# Patient Record
Sex: Female | Born: 1995 | Race: White | Hispanic: No | Marital: Single | Smoking: Current every day smoker
Health system: Southern US, Community
[De-identification: ages and names within clinical notes are randomized; demographics above are authoritative.]

## PROBLEM LIST (undated history)

## (undated) HISTORY — PX: WISDOM TOOTH EXTRACTION: SHX21

---
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
## 2017-01-20 ENCOUNTER — Ambulatory Visit
Admission: EM | Admit: 2017-01-20 | Discharge: 2017-01-20 | Disposition: A | Discharge status: Home / Self Care | Payer: Medicaid Other | Attending: Family Medicine | Admitting: Family Medicine

## 2017-01-20 ENCOUNTER — Encounter: Payer: Self-pay | Admitting: Gynecology

## 2017-01-20 ENCOUNTER — Other Ambulatory Visit: Payer: Self-pay

## 2017-01-20 DIAGNOSIS — R69 Illness, unspecified: Secondary | ICD-10-CM | POA: Diagnosis not present

## 2017-01-20 DIAGNOSIS — R51 Headache: Secondary | ICD-10-CM | POA: Diagnosis not present

## 2017-01-20 DIAGNOSIS — J029 Acute pharyngitis, unspecified: Secondary | ICD-10-CM

## 2017-01-20 DIAGNOSIS — R509 Fever, unspecified: Secondary | ICD-10-CM | POA: Diagnosis not present

## 2017-01-20 DIAGNOSIS — J111 Influenza due to unidentified influenza virus with other respiratory manifestations: Secondary | ICD-10-CM

## 2017-01-20 MED ORDER — OSELTAMIVIR PHOSPHATE 75 MG PO CAPS: 75.0000 mg | ORAL_CAPSULE | Freq: Two times a day (BID) | ORAL | 0 refills | Status: DC

## 2017-01-20 NOTE — ED Triage Notes (Signed)
Per patient c/o bilateral ear pain and sinus problem.

## 2017-01-20 NOTE — ED Provider Notes (Signed)
[facility]   CSN: [id number] Arrival date & time: 01/20/17  1138  History   Chief Complaint Chief Complaint  Patient presents with  . Otalgia  . Sinusitis   HPI  22 year old female presents with subjective fever, headache, ear pain, sore throat.  Started last night.  Subjective fever.  She has not taken her temperature.  She reports associated headache, bilateral otalgia, mild sore throat.  She denies any body aches.  She is taken ibuprofen without resolution.  She states that she feels that has helped her fever.  No reported sick contacts.  No known exacerbating factors.  No other associated symptoms.  No other complaints at this time.  History reviewed. No pertinent past medical history.  Past Surgical History:  Procedure Laterality Date  . WISDOM TOOTH EXTRACTION     OB History    No data available     Home Medications    Prior to Admission medications   Medication Sig Start Date End Date Taking? Authorizing Provider  oseltamivir (TAMIFLU) 75 MG capsule Take 1 capsule (75 mg total) by mouth every 12 (twelve) hours. 01/20/17   Tommie Sams, DO    Family History Family History  Problem Relation Age of Onset  . Healthy Mother   . Healthy Father     Social History Social History   Tobacco Use  . Smoking status: Former Games developer  . Smokeless tobacco: Never Used  Substance Use Topics  . Alcohol use: No  . Drug use: No     Allergies   Patient has no known allergies.   Review of Systems Review of Systems  Constitutional: Positive for fever.  HENT: Positive for ear pain and sore throat.   Neurological: Positive for headaches.   Physical Exam Triage Vital Signs ED Triage Vitals  Enc Vitals Group     BP 01/20/17 1225 (!) 102/51     Pulse Rate 01/20/17 1225 (!) 109     Resp --      Temp 01/20/17 1225 98.5 F (36.9 C)     Temp Source 01/20/17 1225 Oral     SpO2 01/20/17 1225 100 %     Weight 01/20/17 1227 154 lb 15.7 oz (70.3 kg)     Height  01/20/17 1227 5\' 6"  (1.676 m)     Head Circumference --      Peak Flow --      Pain Score 01/20/17 1228 7     Pain Loc --      Pain Edu? --      Excl. in GC? --    Updated Vital Signs BP (!) 102/51 (BP Location: Left Arm)   Pulse (!) 109   Temp 98.5 F (36.9 C) (Oral)   Ht 5\' 6"  (1.676 m)   Wt 154 lb 15.7 oz (70.3 kg)   LMP 01/06/2017   SpO2 100%   BMI 25.01 kg/m     Physical Exam  Constitutional: She is oriented to person, place, and time. She appears well-developed and well-nourished. No distress.  HENT:  Head: Normocephalic and atraumatic.  Eyes: Conjunctivae are normal. Right eye exhibits no discharge. Left eye exhibits no discharge.  Neck: Neck supple.  Cardiovascular:  Tachycardia.  Regular rhythm.  Pulmonary/Chest: Effort normal and breath sounds normal. She has no wheezes. She has no rales.  Lymphadenopathy:    She has no cervical adenopathy.  Neurological: She is alert and oriented to person, place, and time.  Psychiatric: She has a normal mood and affect.  Her behavior is normal.  Nursing note and vitals reviewed.  UC Treatments / Results  Labs (all labs ordered are listed, but only abnormal results are displayed) Labs Reviewed - No data to display  EKG  EKG Interpretation None       Radiology No results found.  Procedures Procedures (including critical care time)  Medications Ordered in UC Medications - No data to display   Initial Impression / Assessment and Plan / UC Course  I have reviewed the triage vital signs and the nursing notes.  Pertinent labs & imaging results that were available during my care of the patient were reviewed by me and considered in my medical decision making (see chart for details).     22 year old female presents with respiratory symptoms consistent with possible influenza.  Patient declined influenza testing today after discussion.  Patient elected to treat empirically for influenza.  Sent in Tamiflu.  Final  Clinical Impressions(s) / UC Diagnoses   Final diagnoses:  Influenza-like illness    ED Discharge Orders        Ordered    oseltamivir (TAMIFLU) 75 MG capsule  Every 12 hours     01/20/17 1246     Controlled Substance Prescriptions [place] Controlled Substance Registry consulted? No   Tommie SamsCook, Rawad Bochicchio G, [place]DO 01/20/17 1300

## 2017-01-20 NOTE — Discharge Instructions (Signed)
Start the tamiflu asap.  Tylenol/motrin as needed.  Take care  Dr. Adriana Simasook

## 2018-11-28 ENCOUNTER — Encounter: Payer: Self-pay | Admitting: Advanced Practice Midwife

## 2018-11-28 ENCOUNTER — Ambulatory Visit (LOCAL_COMMUNITY_HEALTH_CENTER): Payer: Medicaid Other | Admitting: Advanced Practice Midwife

## 2018-11-28 ENCOUNTER — Other Ambulatory Visit: Payer: Self-pay

## 2018-11-28 VITALS — BP 130/76 | Ht 66.0 in | Wt 149.0 lb

## 2018-11-28 DIAGNOSIS — Z3009 Encounter for other general counseling and advice on contraception: Secondary | ICD-10-CM | POA: Diagnosis not present

## 2018-11-28 DIAGNOSIS — F1290 Cannabis use, unspecified, uncomplicated: Secondary | ICD-10-CM | POA: Insufficient documentation

## 2018-11-28 DIAGNOSIS — F17200 Nicotine dependence, unspecified, uncomplicated: Secondary | ICD-10-CM | POA: Insufficient documentation

## 2018-11-28 DIAGNOSIS — F1210 Cannabis abuse, uncomplicated: Secondary | ICD-10-CM | POA: Insufficient documentation

## 2018-11-28 DIAGNOSIS — Z3041 Encounter for surveillance of contraceptive pills: Secondary | ICD-10-CM

## 2018-11-28 DIAGNOSIS — Z113 Encounter for screening for infections with a predominantly sexual mode of transmission: Secondary | ICD-10-CM

## 2018-11-28 LAB — WET PREP FOR TRICH, YEAST, CLUE
Trichomonas Exam: NEGATIVE
Yeast Exam: NEGATIVE

## 2018-11-28 MED ORDER — NORGESTIM-ETH ESTRAD TRIPHASIC 0.18/0.215/0.25 MG-35 MCG PO TABS: 1.0000 | ORAL_TABLET | Freq: Every day | ORAL | 2 refills | Status: DC

## 2018-11-28 MED ORDER — NORGESTIM-ETH ESTRAD TRIPHASIC 0.18/0.215/0.25 MG-25 MCG PO TABS: 1.0000 | ORAL_TABLET | Freq: Every day | ORAL | 2 refills | Status: DC

## 2018-11-28 NOTE — Progress Notes (Signed)
   [place] problem visit  [place] Department  Subjective:  Tracy Mckinney is a 23 y.o. G2P0 smoker (vape) being seen today for STD screen, first pap, more ocp's  Chief Complaint  Patient presents with  . Contraception    needs birth control refilled  . Gynecologic Exam    pap smear  . SEXUALLY TRANSMITTED DISEASE    STD screening    HPI 23 yo G2P0 smoker (vapes daily) wants more OTC.  LMP 11/22/18.  Last sex yesterday without condom.  Last physical 03/2017  Does the patient have a current or past history of drug use? No   No components found for: HCV]   Health Maintenance Due  Topic Date Due  . HIV Screening  01/21/2012  . TETANUS/TDAP  01/21/2016  . PAP-Cervical Cytology Screening  01/20/2018  . PAP SMEAR-Modifier  01/20/2018  . INFLUENZA VACCINE  08/19/2018    ROS  The following portions of the patient's history were reviewed and updated as appropriate: allergies, current medications, past family history, past medical history, past social history, past surgical history and problem list. Problem list updated.   See flowsheet for other program required questions.  Objective:   Vitals:   11/28/18 0828  BP: 130/76  Weight: 149 lb (67.6 kg)  Height: 5\' 6"  (1.676 m)    Physical Exam Abdominal:     Comments: +striae, soft without tenderness  Genitourinary:    General: Normal vulva.     Exam position: Lithotomy position.     Labia:        Right: No lesion.        Left: No lesion.      Vagina: Vaginal discharge (white creamy leukorrhea, ph<4.5) present.     Cervix: Normal.     Uterus: Normal.      Adnexa: Right adnexa normal and left adnexa normal.     Rectum: Normal.       Assessment and Plan:  Tracy Mckinney is a 23 y.o. female presenting to the [facility] Department for a Women's Health problem visit  1. Screening examination for venereal disease Treat wet mount per standing orders Immunization nurse consult -  WET PREP FOR [place], YEAST, CLUE - Syphilis Serology, [facility]  2. Family planning First pap done - Pap IG (Image Guided)  3. Encounter for surveillance of contraceptive pills OTC #3 I po daily. To return 3 mo for BP check  4. Smoker--vapes  Counseled via 5 A's to stop vaping.  BP=130/76.  Repeat =112/69  5. Marijuana abuse Last use 2 wks ago     Return in about 3 months (around 02/28/2019) for BP check.  No future appointments.  Herbie Saxon, CNM

## 2018-11-28 NOTE — Progress Notes (Signed)
Pt here for more OCP's pt currently on Ortho Tri Cyclen. Pt reports she was on Prednisone for poison ivy and that she took her last dose this morning. Last period was a normal flow but came 2 or 3 days late (started 11/22/2018). Pt also desires STD screening and blood work for HIV and syphillis.Ronny Bacon, RN

## 2018-11-28 NOTE — Progress Notes (Signed)
Wet mount reviewed and is negative today, so no treatment needed for wet mount per standing order. Pt's repeat BP is 112/69. Counseled pt per Ola Spurr, CNM orders and pt aware that OCP's were e-prescribed by Ola Spurr, CNM to pt's pharmacy on file. Pt aware to give [place] a call with any questions or concerns. Provider orders completed.Ronny Bacon, RN

## 2018-12-05 LAB — PAP IG (IMAGE GUIDED): PAP Smear Comment: 0

## 2018-12-08 ENCOUNTER — Telehealth: Payer: Self-pay

## 2018-12-08 DIAGNOSIS — A749 Chlamydial infection, unspecified: Secondary | ICD-10-CM

## 2018-12-08 NOTE — Telephone Encounter (Signed)
TC to patient. Verified ID via password/SS#. Informed of positive chlamydia and need for tx. Instructed to eat before visit and have partner call for tx appt. Appt scheduled.Pearlene Teat, RN    

## 2018-12-11 ENCOUNTER — Telehealth: Payer: Self-pay

## 2018-12-11 NOTE — Telephone Encounter (Signed)
TC from patient.  Reports made an appt with PCP for tx this week.  Declines to R/S tx appt here at [place], RN

## 2019-02-07 ENCOUNTER — Telehealth: Payer: Self-pay | Admitting: Family Medicine

## 2019-02-07 ENCOUNTER — Other Ambulatory Visit: Payer: Self-pay | Admitting: Advanced Practice Midwife

## 2019-02-07 DIAGNOSIS — Z3009 Encounter for other general counseling and advice on contraception: Secondary | ICD-10-CM

## 2019-02-07 NOTE — Telephone Encounter (Signed)
Per chart at IS visit, 11/2018, patient had elevated BP and was given 3 packs of pills with note stating that she needs to RTC for BP check prior to being given more pills.  Please schedule for BP check.

## 2019-02-07 NOTE — Telephone Encounter (Signed)
TC to patient to schedule BP check appointment before more OC prescribed. LM with number to call.Burt Knack, RN

## 2019-02-07 NOTE — Telephone Encounter (Signed)
Patient would like a prescription for more bc pills sent to her pharmacy... [facility] [address].

## 2019-02-07 NOTE — Telephone Encounter (Signed)
Pt cannot have any more ocp's until she comes in for BP check.  RN attempted to call pt on 02/07/19 to tell her this

## 2019-02-08 NOTE — Telephone Encounter (Signed)
Pt. Is returning a nurses call about her birth control pills.

## 2019-02-09 NOTE — Telephone Encounter (Signed)
TC with patient. Explained that she needs a provider appt to reck BP before she receives more pills.  Appt scheduled. Richmond Campbell, RN

## 2019-02-12 ENCOUNTER — Other Ambulatory Visit: Payer: Self-pay | Admitting: Family Medicine

## 2019-02-12 ENCOUNTER — Encounter: Payer: Self-pay | Admitting: Family Medicine

## 2019-02-12 ENCOUNTER — Ambulatory Visit: Payer: Medicaid Other | Admitting: Family Medicine

## 2019-02-12 ENCOUNTER — Other Ambulatory Visit: Payer: Self-pay

## 2019-02-12 DIAGNOSIS — F17200 Nicotine dependence, unspecified, uncomplicated: Secondary | ICD-10-CM

## 2019-02-12 DIAGNOSIS — Z3041 Encounter for surveillance of contraceptive pills: Secondary | ICD-10-CM

## 2019-02-12 DIAGNOSIS — Z3009 Encounter for other general counseling and advice on contraception: Secondary | ICD-10-CM

## 2019-02-12 MED ORDER — NORGESTIM-ETH ESTRAD TRIPHASIC 0.18/0.215/0.25 MG-35 MCG PO TABS: 1.0000 | ORAL_TABLET | Freq: Every day | ORAL | 13 refills | Status: DC

## 2019-02-12 MED ORDER — NORGESTIM-ETH ESTRAD TRIPHASIC 0.18/0.215/0.25 MG-35 MCG PO TABS: 1.0000 | ORAL_TABLET | Freq: Every day | ORAL | 11 refills | Status: DC

## 2019-02-12 NOTE — Progress Notes (Signed)
Family Planning Visit  Subjective:  Tracy Mckinney is a 24 y.o. being seen today for  Chief Complaint  Patient presents with  . Contraception    Pt has Smoker--vapes  and Marijuana abuse on their problem list.  HPI  Patient reports she is here for refill of OCP. Has been taking x1.5 yrs. She vapes, does not smoke cigarettes.   Pt does not meet any of the following contraindications to estrogen use: -Age ?35 years and smoking ?15 cigarettes per day -Migraine with aura -Two or more RF for arterial CVD (such as older age, smoking, diabetes, and hypertension) -HTN -Breast cancer -VTE hx or acute event -Known thrombogenic mutations -Known ischemic heart disease -History of stroke -Complicated valvular heart disease (pulmonary HTN, risk for afib, hx subacute bacterial endocarditis) -Cirrhosis, Hepatocellular adenoma or malignant hepatoma    No LMP recorded. Pt desires EC? N/a - continuous use OCP  Last pap: Nov, 2020.  Patient reports 1 partner(s) in last year. Do they desire STI screening (if no, why not)? no  Does the patient desire a pregnancy in the next year? no   24 y.o., There is no height or weight on file to calculate BMI. - Is patient eligible for HA1C diabetes screening based on BMI and age >30?  no  Does the patient have a current or past history of drug use? yes No components found for: HCV  See flowsheet for other program required questions.   Health Maintenance Due  Topic Date Due  . TETANUS/TDAP  01/21/2016  . INFLUENZA VACCINE  08/19/2018    ROS  The following portions of the patient's history were reviewed and updated as appropriate: allergies, current medications, past family history, past medical history, past social history, past surgical history and problem list. Problem list updated.  Objective:  There were no vitals taken for this visit.  RN showed me vitals, which were all WNL, though they weren't entered into EMR. I believe BP was 127/80.    Physical Exam  Gen: well appearing, NAD HEENT: no scleral icterus Lung: Normal WOB Ext: warm well perfused     Assessment and Plan:  Tracy Mckinney is a 24 y.o. female presenting to the [facility] Department for a well woman exam/family planning visit  Contraception counseling: Reviewed all forms of birth control options in the tiered based approach including abstinence; over the counter/barrier methods; hormonal contraceptive medication including pill, patch, ring, injection, contraceptive implant; hormonal and nonhormonal IUDs; permanent sterilization options including vasectomy and the various tubal sterilization modalities. Risks, benefits, how to discontinue and typical effectiveness rates were reviewed.  Questions were answered.  Written information was also given to the patient to review.  Patient desires OCP, this was prescribed for patient. She will follow up in  1 yr for surveillance.  She was told to call with any further questions, or with any concerns about this method of contraception.  Emphasized use of condoms 100% of the time for STI prevention.  Emergency Contraception: n/a - continuous use OCP   1. Encounter for surveillance of contraceptive pills -Rx OCP x1 yr. Counseling as above - Norgestimate-Ethinyl Estradiol Triphasic (ORTHO TRI-CYCLEN, 28,) 0.18/0.215/0.25 MG-35 MCG tablet; Take 1 tablet by mouth daily.  Dispense: 1 Package; Refill: 11  2. Smoker--vapes  -Risks of tob use with OCP discussed. Pt's goal is to quit completely. Encouraged pt in their desire to quit, counseled using 5 A's. Tobacco Quitline info given and offered referral for behavioral health which pt declines  for now.     Return in about 1 year (around 02/12/2020) for yearly wellness exam.  No future appointments.  Kandee Keen, PA-C

## 2019-02-12 NOTE — Progress Notes (Signed)
Patient here for BP check and needs more OC. Patient pharmacy verified. Patient was told at last visit that she would need BP check before receiving another OC prescription. Patient on her way to work, and just picked up last pack OC.Marland KitchenBurt Knack, RN

## 2019-02-28 ENCOUNTER — Ambulatory Visit: Payer: Medicaid Other

## 2019-06-10 ENCOUNTER — Encounter: Payer: Self-pay | Admitting: Emergency Medicine

## 2019-06-10 ENCOUNTER — Other Ambulatory Visit: Payer: Self-pay

## 2019-06-10 ENCOUNTER — Emergency Department
Admission: EM | Admit: 2019-06-10 | Discharge: 2019-06-10 | Disposition: A | Discharge status: Home / Self Care | Payer: Medicaid Other | Attending: Student | Admitting: Student

## 2019-06-10 DIAGNOSIS — N898 Other specified noninflammatory disorders of vagina: Secondary | ICD-10-CM | POA: Insufficient documentation

## 2019-06-10 DIAGNOSIS — F17290 Nicotine dependence, other tobacco product, uncomplicated: Secondary | ICD-10-CM | POA: Insufficient documentation

## 2019-06-10 DIAGNOSIS — R509 Fever, unspecified: Secondary | ICD-10-CM | POA: Insufficient documentation

## 2019-06-10 MED ORDER — DOXYCYCLINE HYCLATE 100 MG PO CAPS
100.0000 mg | ORAL_CAPSULE | Freq: Two times a day (BID) | ORAL | 0 refills | Status: AC
Start: 2019-06-10 — End: 2019-06-20

## 2019-06-10 MED ORDER — AZITHROMYCIN 500 MG PO TABS
1000.0000 mg | ORAL_TABLET | Freq: Once | ORAL | Status: AC
  Administered 2019-06-10: 1000 mg via ORAL
  Filled 2019-06-10: qty 2

## 2019-06-10 MED ORDER — METRONIDAZOLE 500 MG PO TABS
500.0000 mg | ORAL_TABLET | Freq: Two times a day (BID) | ORAL | 0 refills | Status: AC
Start: 2019-06-10 — End: 2019-06-20

## 2019-06-10 NOTE — Discharge Instructions (Signed)
Thank you for letting us take care of you in the emergency department today.   Please continue to take any regular, prescribed medications.   New medications we have prescribed:  Doxycycline Metronidazole  Please take these prescriptions as written.  Take with food as sometimes they can cause upset stomach.  We advise nothing in the vagina until the area of irritation is healed, including tampons, intercourse, etc.  Please follow up with: An OB/GYN doctor to follow-up on your symptoms and for an exam recheck.  Please follow-up with one of the doctors listed below.  Please return to the ER for any new or worsening symptoms.

## 2019-06-10 NOTE — ED Triage Notes (Signed)
Pt to ED via POV, pt states that she has laceration at her vaginal opening. Pt states that it is now painful and swollen. Pt states that she also has fever and chills. Pt is in NAD, tachy in triage at 111

## 2019-06-10 NOTE — ED Provider Notes (Signed)
[facility] Emergency Department Provider Note  ____________________________________________   First MD Initiated Contact with Patient 06/10/19 1039     (approximate)  I have reviewed the triage vital signs and the nursing notes.  History  Chief Complaint Laceration and Fever    HPI Tracy Mckinney is a 24 y.o. female past medical history as below, who presents to the emergency department with concern for possible vaginal laceration.  Patient states she was putting a tampon in on Friday when she thinks she might have caused a small paper cut type irritation to the inferior portion of her vaginal mucosa.  Pain is located at the inferior portion of the introitus on the vaginal mucosa, 7/10 in severity, described as burning/stinging.  No radiation.  Pain is increased with urination and with wiping.  Over the weekend, she feels like she has had some related swelling to the area as well as subjective fevers and chills.  She denies any drainage from the area. Denies any trauma related to intercourse or any other objects in the vagina.    Past Medical Hx History reviewed. No pertinent past medical history.  Problem List Patient Active Problem List   Diagnosis Date Noted  . Smoker--vapes  11/28/2018  . Marijuana abuse 11/28/2018    Past Surgical Hx Past Surgical History:  Procedure Laterality Date  . WISDOM TOOTH EXTRACTION      Medications Prior to Admission medications   Medication Sig Start Date End Date Taking? Authorizing Provider  Norgestimate-Ethinyl Estradiol Triphasic (ORTHO TRI-CYCLEN, 28,) 0.18/0.215/0.25 MG-35 MCG tablet Take 1 tablet by mouth daily. 02/12/19   Caren Macadam, MD  oseltamivir (TAMIFLU) 75 MG capsule Take 1 capsule (75 mg total) by mouth every 12 (twelve) hours. Patient not taking: Reported on 11/28/2018 01/20/17   Coral Spikes, DO    Allergies Patient has no known allergies.  Family Hx Family History  Problem Relation  Age of Onset  . Hypertension Mother   . Migraines Mother   . Bipolar disorder Mother   . Cancer Father     Social Hx Social History   Tobacco Use  . Smoking status: Current Every Day Smoker    Types: E-cigarettes  . Smokeless tobacco: Never Used  Substance Use Topics  . Alcohol use: No  . Drug use: Yes    Frequency: 2.0 times per week    Types: Marijuana     Review of Systems  Constitutional: Negative for fever. Negative for chills. Eyes: Negative for visual changes. ENT: Negative for sore throat. Cardiovascular: Negative for chest pain. Respiratory: Negative for shortness of breath. Gastrointestinal: Negative for nausea. Negative for vomiting.  Genitourinary: Negative for dysuria. + vaginal pain Musculoskeletal: Negative for leg swelling. Skin: Negative for rash. Neurological: Negative for headaches.   Physical Exam  Vital Signs: ED Triage Vitals  Enc Vitals Group     BP 06/10/19 0959 116/71     Pulse Rate 06/10/19 0959 (!) 111     Resp 06/10/19 0959 16     Temp 06/10/19 0959 98.3 F (36.8 C)     Temp Source 06/10/19 0959 Oral     SpO2 06/10/19 0959 98 %     Weight 06/10/19 1001 150 lb (68 kg)     Height 06/10/19 1001 5\' 6"  (1.676 m)     Head Circumference --      Peak Flow --      Pain Score 06/10/19 1000 7     Pain Loc --  Pain Edu? --      Excl. in GC? --     Constitutional: Alert and oriented. Well appearing. NAD.  Head: Normocephalic. Atraumatic. Eyes: Conjunctivae clear. Sclera anicteric. Pupils equal and symmetric. Nose: No masses or lesions. No congestion or rhinorrhea. Mouth/Throat: Wearing mask.  Neck: No stridor. Trachea midline.  Cardiovascular: Tachycardic, regular rhythm. Extremities well perfused. Respiratory: Normal respiratory effort.  No hypoxia. Genitourinary: RN chaperone present. Area of irritation noted at the inferior portion of the introitus to the vaginal mucosa, 6 o'clock position. No active drainage. No underlying  palpable induration or fluctuance suggestive of abscess.  No Bartholin gland cyst.  No vesicles or lesions.  Area of irritation is about the size of a dime.  No raised borders, does not seem consistent with a clear ulcer or chancroid. Musculoskeletal: No lower extremity edema. No deformities. Neurologic:  Normal speech and language. No gross focal or lateralizing neurologic deficits are appreciated.  Skin: Skin is warm, dry and intact. No rash noted. Psychiatric: Mood and affect are appropriate for situation.    Procedures  Procedure(s) performed (including critical care):  Procedures   Initial Impression / Assessment and Plan / MDM / ED Course  24 y.o. female who presents to the ED for vaginal pain and vaginal irritation to the inferior part of the mucosal introitus. She suspects etiology was a small scratch to the mucosal surface when inserting a tampon several days ago. Hx as above.  On exam, she has an area of irritation noted at the inferior portion of the introitus to the vaginal mucosa, 6 o'clock position. No active drainage. No underlying palpable induration or fluctuance suggestive of abscess.  No Bartholin gland cyst.  No vesicles or lesions.  Area of irritation is about the size of a dime.  No raised borders, does not seem consistent with a clear ulcer or chancroid.  Suspect she may have caused some mucosal disruption with the tampon insertion and now has some mild superimposed infection/irritation.  Will plan to cover with a course of antibiotics. Doubt chancroid based on appearance, but out of an abundance of caution will cover with 1 gram azithromycin here.  Discussed case with Dr. Valentino Saxon of OB/GYN, who agrees with coverage with antibiotics.  Recommends doxycycline for general vaginal coverage, and will also add on metronidazole for anaerobic coverage.  Patient agreeable with the plan.  Advised outpatient OB/GYN follow-up and given return precautions.     _______________________________   As part of my medical decision making I have reviewed available labs, radiology tests, reviewed old records/performed chart review, obtained additional history from family, and discussed with consultants (OB/GYN).     Final Clinical Impression(s) / ED Diagnosis  Final diagnoses:  Vaginal irritation       Note:  This document was prepared using Dragon voice recognition software and may include unintentional dictation errors.   Miguel Aschoff., MD 06/10/19 1155

## 2019-06-10 NOTE — ED Notes (Signed)
Patient does not want vitals done before discharge. 

## 2019-06-15 ENCOUNTER — Other Ambulatory Visit (HOSPITAL_COMMUNITY)
Admission: RE | Admit: 2019-06-15 | Discharge: 2019-06-15 | Disposition: A | Discharge status: Home / Self Care | Payer: Medicaid Other | Source: Ambulatory Visit | Attending: Certified Nurse Midwife | Admitting: Certified Nurse Midwife

## 2019-06-15 ENCOUNTER — Ambulatory Visit (INDEPENDENT_AMBULATORY_CARE_PROVIDER_SITE_OTHER): Payer: Self-pay | Admitting: Certified Nurse Midwife

## 2019-06-15 ENCOUNTER — Other Ambulatory Visit: Payer: Self-pay

## 2019-06-15 ENCOUNTER — Encounter: Payer: Self-pay | Admitting: Certified Nurse Midwife

## 2019-06-15 VITALS — BP 101/77 | HR 104 | Ht 66.0 in | Wt 145.7 lb

## 2019-06-15 DIAGNOSIS — Z113 Encounter for screening for infections with a predominantly sexual mode of transmission: Secondary | ICD-10-CM

## 2019-06-15 DIAGNOSIS — N898 Other specified noninflammatory disorders of vagina: Secondary | ICD-10-CM

## 2019-06-15 DIAGNOSIS — R238 Other skin changes: Secondary | ICD-10-CM

## 2019-06-15 DIAGNOSIS — R6883 Chills (without fever): Secondary | ICD-10-CM

## 2019-06-15 DIAGNOSIS — Z8619 Personal history of other infectious and parasitic diseases: Secondary | ICD-10-CM

## 2019-06-15 DIAGNOSIS — R509 Fever, unspecified: Secondary | ICD-10-CM

## 2019-06-15 MED ORDER — ONDANSETRON 4 MG PO TBDP
4.0000 mg | ORAL_TABLET | Freq: Four times a day (QID) | ORAL | 0 refills | Status: DC | PRN
Start: 2019-06-15 — End: 2019-12-06

## 2019-06-15 MED ORDER — LIDOCAINE 5 % EX OINT
1.0000 | TOPICAL_OINTMENT | CUTANEOUS | 0 refills | Status: DC | PRN
Start: 2019-06-15 — End: 2019-12-06

## 2019-06-15 MED ORDER — VALACYCLOVIR HCL 1 G PO TABS: 1000.0000 mg | ORAL_TABLET | Freq: Two times a day (BID) | ORAL | 3 refills | Status: AC

## 2019-06-15 NOTE — Progress Notes (Signed)
New pt ED follow up for vaginal irritation. Pt stated having irritation x 7 days. Pt noticed a cut on the outside of her vaginal last Friday. Pt noticed chills, pain, bumps and sore everywhere on the vaginal area and the sores are draining.

## 2019-06-15 NOTE — Progress Notes (Signed)
GYN ENCOUNTER NOTE  Subjective:       Tracy Mckinney is a 24 y.o. G23P0020 female is here for gynecologic evaluation of the following issues:  1. Vaginal Irritation 2. Vaginal Bumps 3. Vaginal Tear 4. Dysuria 5. Chills 6.  Fever   One (1) week ago noticed a cut on outside of her vagina that happened during intercourse.   On Saturday, noticed vaginal swelling and irritation with fever and cillis.   Went to ER on Sunday and was treated for vaginal infection.  Bumps first appeared on Tuesday. They are painful to touch, open and draining, no relief with home treatment measures.  Denies difficulty breathing or respiratory distress, chest pain, abdominal pain, excessive vaginal bleeding, and leg pain or swelling  Remote history of chlamydia infection.   Gynecologic History  Patient's last menstrual period was 06/06/2019.   Contraception: OCP (estrogen/progesterone)   Last Pap: 11/28/2018. Results were: Negative  Obstetric History  OB History  Gravida Para Term Preterm AB Living  2       2    SAB TAB Ectopic Multiple Live Births  1 1          # Outcome Date GA Lbr Len/2nd Weight Sex Delivery Anes PTL Lv  2 SAB 2018          1 TAB 2017            Past Medical History:  Diagnosis Date  . No pertinent past medical history     Past Surgical History:  Procedure Laterality Date  . WISDOM TOOTH EXTRACTION      Current Outpatient Medications on File Prior to Visit  Medication Sig Dispense Refill  . doxycycline (VIBRAMYCIN) 100 MG capsule Take 1 capsule (100 mg total) by mouth 2 (two) times daily for 10 days. 20 capsule 0  . metroNIDAZOLE (FLAGYL) 500 MG tablet Take 1 tablet (500 mg total) by mouth 2 (two) times daily for 10 days. 20 tablet 0  . Norgestimate-Ethinyl Estradiol Triphasic (ORTHO TRI-CYCLEN, 28,) 0.18/0.215/0.25 MG-35 MCG tablet Take 1 tablet by mouth daily. 1 Package 13   No current facility-administered medications on file prior to visit.    No Known  Allergies  Social History   Socioeconomic History  . Marital status: Single    Spouse name: Not on file  . Number of children: Not on file  . Years of education: Not on file  . Highest education level: Not on file  Occupational History  . Not on file  Tobacco Use  . Smoking status: Never Smoker  . Smokeless tobacco: Never Used  Substance and Sexual Activity  . Alcohol use: Yes    Comment: occas  . Drug use: Yes    Frequency: 2.0 times per week    Types: Marijuana  . Sexual activity: Yes    Partners: Male    Birth control/protection: Pill, Condom  Other Topics Concern  . Not on file  Social History Narrative  . Not on file   Social Determinants of Health   Financial Resource Strain:   . Difficulty of Paying Living Expenses:   Food Insecurity:   . Worried About Charity fundraiser in the Last Year:   . Arboriculturist in the Last Year:   Transportation Needs:   . Film/video editor (Medical):   Marland Kitchen Lack of Transportation (Non-Medical):   Physical Activity:   . Days of Exercise per Week:   . Minutes of Exercise per Session:   Stress:   .  Feeling of Stress :   Social Connections:   . Frequency of Communication with Friends and Family:   . Frequency of Social Gatherings with Friends and Family:   . Attends Religious Services:   . Active Member of Clubs or Organizations:   . Attends Banker Meetings:   Marland Kitchen Marital Status:   Intimate Partner Violence:   . Fear of Current or Ex-Partner:   . Emotionally Abused:   Marland Kitchen Physically Abused:   . Sexually Abused:     Family History  Problem Relation Age of Onset  . Hypertension Mother   . Migraines Mother   . Bipolar disorder Mother   . Cancer Father     The following portions of the patient's history were reviewed and updated as appropriate: allergies, current medications, past family history, past medical history, past social history, past surgical history and problem list.  Review of Systems  ROS  negative except as noted above. Information obtained from patient.   Objective:   BP 101/77   Pulse (!) 104   Ht 5\' 6"  (1.676 m)   Wt 145 lb 11.2 oz (66.1 kg)   LMP 06/06/2019   BMI 23.52 kg/m    CONSTITUTIONAL: Well-developed, well-nourished female in no acute distress.   PELVIC:  External Genitalia: Swelling and redness noted. Vesicles to mons pubis, labia majora, labia minora and rectum. Vesicles are pen tip sized clear with white center  Vagina: red and edematous. Red raised vesicles in vagina  Cervix: Exam deferred: Patient unable to tolerate due to pain.   Assessment:   1. 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 Vaginal irritation  - HSV Type I/II IgG, IgMw/ reflex - Herpes simplex virus culture  3. Chills  - HSV Type I/II IgG, IgMw/ reflex - Herpes simplex virus culture  4. Fever, unspecified fever cause  - HSV Type I/II IgG, IgMw/ reflex - Herpes simplex virus culture  5. Routine screening for STI (sexually transmitted infection)  - Cervicovaginal ancillary only - HSV Type I/II IgG, IgMw/ reflex - RPR - Herpes simplex virus culture   6. History of Chlamydia  - Cervicovaginal ancillary only  Plan:   Labs done today: See Orders  Valtrex RX'd: See Orders and AVS  Zofran Rx'd: See Orders.   Lidocaine cream Rx'd: Discussed OTC treatment options for lidocaine if prescription was not covered.   Reviewed red flag symptoms and when to call the office.  RTC x 2-3 weeks for FOLLOW UP or sooner if needed.   06/08/2019 RN [facility] [employer] 06/15/19 12:45 PM

## 2019-06-15 NOTE — Progress Notes (Signed)
I have seen, interviewed, and examined the patient in conjunction with the [employer] Nurse [facility] Practitioner student and affirm the diagnosis and management plan.   Gunnar Bulla, CNM [facility], [facility] 06/15/19 12:54 PM

## 2019-06-15 NOTE — Patient Instructions (Addendum)
Safe Sex Practicing safe sex means taking steps before and during sex to reduce your risk of:  Getting an STI (sexually transmitted infection).  Giving your partner an STI.  Unwanted or unplanned pregnancy. How can I practice safe sex?     Ways you can practice safe sex  Limit your sexual partners to only one partner who is having sex with only you.  Avoid using alcohol and drugs before having sex. Alcohol and drugs can affect your judgment.  Before having sex with a new partner: ? Talk to your partner about past partners, past STIs, and drug use. ? Get screened for STIs and discuss the results with your partner. Ask your partner to get screened, too.  Check your body regularly for sores, blisters, rashes, or unusual discharge. If you notice any of these problems, visit your health care provider.  Avoid sexual contact if you have symptoms of an infection or you are being treated for an STI.  While having sex, use a condom. Make sure to: ? Use a condom every time you have vaginal, oral, or anal sex. Both females and males should wear condoms during oral sex. ? Keep condoms in place from the beginning to the end of sexual activity. ? Use a latex condom, if possible. Latex condoms offer the best protection. ? Use only water-based lubricants with a condom. Using petroleum-based lubricants or oils will weaken the condom and increase the chance that it will break. Ways your health care provider can help you practice safe sex  See your health care provider for regular screenings, exams, and tests for STIs.  Talk with your health care provider about what kind of birth control (contraception) is best for you.  Get vaccinated against hepatitis B and human papillomavirus (HPV).  If you are at risk of being infected with HIV (human immunodeficiency virus), talk with your health care provider about taking a prescription medicine to prevent HIV infection. You are at risk for HIV if  you: ? Are a man who has sex with other men. ? Are sexually active with more than one partner. ? Take drugs by injection. ? Have a sex partner who has HIV. ? Have unprotected sex. ? Have sex with someone who has sex with both men and women. ? Have had an STI. Follow these instructions at home:  Take over-the-counter and prescription medicines as told by your health care provider.  Keep all follow-up visits as told by your health care provider. This is important. Where to find more information  Centers for Disease Control and Prevention: https://www.cdc.gov/std/prevention/default.htm  Planned Parenthood: https://www.plannedparenthood.org/  Office on Women's Health: https://www.womenshealth.gov/a-z-topics/sexually-transmitted-infections Summary  Practicing safe sex means taking steps before and during sex to reduce your risk of STIs, giving your partner STIs, and having an unwanted or unplanned pregnancy.  Before having sex with a new partner, talk to your partner about past partners, past STIs, and drug use.  Use a condom every time you have vaginal, oral, or anal sex. Both females and males should wear condoms during oral sex.  Check your body regularly for sores, blisters, rashes, or unusual discharge. If you notice any of these problems, visit your health care provider.  See your health care provider for regular screenings, exams, and tests for STIs. This information is not intended to replace advice given to you by your health care provider. Make sure you discuss any questions you have with your health care provider. Document Revised: 04/28/2018 Document Reviewed: 10/17/2017 Elsevier Patient Education    2020 Elsevier Inc.   Vaginitis  Vaginitis is irritation and swelling (inflammation) of the vagina. It happens when normal bacteria and yeast in the vagina grow too much. There are many types of this condition. Treatment will depend on the type you have. Follow these  instructions at home: Lifestyle  Keep your vagina area clean and dry. ? Avoid using soap. ? Rinse the area with water.  Do not do the following until your doctor says it is okay: ? Wash and clean out the vagina (douche). ? Use tampons. ? Have sex.  Wipe from front to back after going to the bathroom.  Let air reach your vagina. ? Wear cotton underwear. ? Do not wear:  Underwear while you sleep.  Tight pants.  Thong underwear.  Underwear or nylons without a cotton panel. ? Take off any wet clothing, such as bathing suits, as soon as possible.  Use gentle, non-scented products. Do not use things that can irritate the vagina, such as fabric softeners. Avoid the following products if they are scented: ? Feminine sprays. ? Detergents. ? Tampons. ? Feminine hygiene products. ? Soaps or bubble baths.  Practice safe sex and use condoms. General instructions  Take over-the-counter and prescription medicines only as told by your doctor.  If you were prescribed an antibiotic medicine, take or use it as told by your doctor. Do not stop taking or using the antibiotic even if you start to feel better.  Keep all follow-up visits as told by your doctor. This is important. Contact a doctor if:  You have pain in your belly.  You have a fever.  Your symptoms last for more than 2-3 days. Get help right away if:  You have a fever and your symptoms get worse all of a sudden. Summary  Vaginitis is irritation and swelling of the vagina. It can happen when the normal bacteria and yeast in the vagina grow too much. There are many types.  Treatment will depend on the type you have.  Do not douche, use tampons , or have sex until your health care provider approves. When you can return to sex, practice safe sex and use condoms. This information is not intended to replace advice given to you by your health care provider. Make sure you discuss any questions you have with your health care  provider. Document Revised: 12/17/2016 Document Reviewed: 01/27/2016 Elsevier Patient Education  2020 Elsevier Inc.   Genital Herpes Genital herpes is a common sexually transmitted infection (STI) that is caused by a virus. The virus spreads from person to person through sexual contact. Infection can cause itching, blisters, and sores around the genitals or rectum. Symptoms may last several days and then go away This is called an outbreak. However, the virus remains in your body, so you may have more outbreaks in the future. The time between outbreaks varies and can be months or years. Genital herpes affects men and women. It is particularly concerning for pregnant women because the virus can be passed to the baby during delivery and can cause serious problems. Genital herpes is also a concern for people who have a weak disease-fighting (immune) system. What are the causes? This condition is caused by the herpes simplex virus (HSV) type 1 or type 2. The virus may spread through:  Sexual contact with an infected person, including vaginal, anal, and oral sex.  Contact with fluid from a herpes sore.  The skin. This means that you can get herpes from an infected partner  even if he or she does not have a visible sore or does not know that he or she is infected. What increases the risk? You are more likely to develop this condition if:  You have sex with many partners.  You do not use latex condoms during sex. What are the signs or symptoms? Most people do not have symptoms (asymptomatic) or have mild symptoms that may be mistaken for other skin problems. Symptoms may include:  Small red bumps near the genitals, rectum, or mouth. These bumps turn into blisters and then turn into sores.  Flu-like symptoms, including: ? Fever. ? Body aches. ? Swollen lymph nodes. ? Headache.  Painful urination.  Pain and itching in the genital area or rectal area.  Vaginal discharge.  Tingling or  shooting pain in the legs and buttocks. Generally, symptoms are more severe and last longer during the first (primary) outbreak. Flu-like symptoms are also more common during the primary outbreak. How is this diagnosed? Genital herpes may be diagnosed based on:  A physical exam.  Your medical history.  Blood tests.  A test of a fluid sample (culture) from an open sore. How is this treated? There is no cure for this condition, but treatment with antiviral medicines that are taken by mouth (orally) can do the following:  Speed up healing and relieve symptoms.  Help to reduce the spread of the virus to sexual partners.  Limit the chance of future outbreaks, or make future outbreaks shorter.  Lessen symptoms of future outbreaks. Your health care provider may also recommend pain relief medicines, such as aspirin or ibuprofen. Follow these instructions at home: Sexual activity  Do not have sexual contact during active outbreaks.  Practice safe sex. Latex condoms and female condoms may help prevent the spread of the herpes virus. General instructions  Keep the affected areas dry and clean.  Take over-the-counter and prescription medicines only as told by your health care provider.  Avoid rubbing or touching blisters and sores. If you do touch blisters or sores: ? Wash your hands thoroughly with soap and water. ? Do not touch your eyes afterward.  To help relieve pain or itching, you may take the following actions as directed by your health care provider: ? Apply a cold, wet cloth (cold compress) to affected areas 4-6 times a day. ? Apply a substance that protects your skin and reduces bleeding (astringent). ? Apply a gel that helps relieve pain around sores (lidocaine gel). ? Take a warm, shallow bath that cleans the genital area (sitz bath).  Keep all follow-up visits as told by your health care provider. This is important. How is this prevented?  Use condoms. Although  anyone can get genital herpes during sexual contact, even with the use of a condom, a condom can provide some protection.  Avoid having multiple sexual partners.  Talk with your sexual partner about any symptoms either of you may have. Also, talk with your partner about any history of STIs.  Get tested for STIs before you have sex. Ask your partner to do the same.  Do not have sexual contact if you have symptoms of genital herpes. Contact a health care provider if:  Your symptoms are not improving with medicine.  Your symptoms return.  You have new symptoms.  You have a fever.  You have abdominal pain.  You have redness, swelling, or pain in your eye.  You notice new sores on other parts of your body.  You are a woman  and experience bleeding between menstrual periods.  You have had herpes and you become pregnant or plan to become pregnant. Summary  Genital herpes is a common sexually transmitted infection (STI) that is caused by the herpes simplex virus (HSV) type 1 or type 2.  These viruses are most often spread through sexual contact with an infected person.  You are more likely to develop this condition if you have sex with many partners or you have unprotected sex.  Most people do not have symptoms (asymptomatic) or have mild symptoms that may be mistaken for other skin problems. Symptoms occur as outbreaks that may happen months or years apart.  There is no cure for this condition, but treatment with oral antiviral medicines can reduce symptoms, reduce the chance of spreading the virus to a partner, prevent future outbreaks, or shorten future outbreaks. This information is not intended to replace advice given to you by your health care provider. Make sure you discuss any questions you have with your health care provider. Document Revised: 07/11/2017 Document Reviewed: 12/05/2015 Elsevier Patient Education  [place].   Valacyclovir caplets What is this  medicine? VALACYCLOVIR (val ay SYE kloe veer) is an antiviral medicine. It is used to treat or prevent infections caused by certain kinds of viruses. Examples of these infections include herpes and shingles. This medicine will not cure herpes. This medicine may be used for other purposes; ask your health care provider or pharmacist if you have questions. COMMON BRAND NAME(S): Valtrex What should I tell my health care provider before I take this medicine? They need to know if you have any of these conditions:  acquired immunodeficiency syndrome (AIDS)  any other condition that may weaken the immune system  bone marrow or kidney transplant  kidney disease  an unusual or allergic reaction to valacyclovir, acyclovir, ganciclovir, valganciclovir, other medicines, foods, dyes, or preservatives  pregnant or trying to get pregnant  breast-feeding How should I use this medicine? Take this medicine by mouth with a glass of water. Follow the directions on the prescription label. You can take this medicine with or without food. Take your doses at regular intervals. Do not take your medicine more often than directed. Finish the full course prescribed by your doctor or health care professional even if you think your condition is better. Do not stop taking except on the advice of your doctor or health care professional. Talk to your pediatrician regarding the use of this medicine in children. While this drug may be prescribed for children as young as 2 years for selected conditions, precautions do apply. Overdosage: If you think you have taken too much of this medicine contact a poison control center or emergency room at once. NOTE: This medicine is only for you. Do not share this medicine with others. What if I miss a dose? If you miss a dose, take it as soon as you can. If it is almost time for your next dose, take only that dose. Do not take double or extra doses. What may interact with this  medicine? Do not take this medicine with any of the following medications:  cidofovir This medicine may also interact with the following medications:  adefovir  amphotericin B  certain antibiotics like amikacin, gentamicin, tobramycin, vancomycin  cimetidine  cisplatin  colistin  cyclosporine  foscarnet  lithium  methotrexate  probenecid  tacrolimus This list may not describe all possible interactions. Give your health care provider a list of all the medicines, herbs, non-prescription drugs,  or dietary supplements you use. Also tell them if you smoke, drink alcohol, or use illegal drugs. Some items may interact with your medicine. What should I watch for while using this medicine? Tell your doctor or health care professional if your symptoms do not start to get better after 1 week. This medicine works best when taken early in the course of an infection, within the first 72 hours. Begin treatment as soon as possible after the first signs of infection like tingling, itching, or pain in the affected area. It is possible that genital herpes may still be spread even when you are not having symptoms. Always use safer sex practices like condoms made of latex or polyurethane whenever you have sexual contact. You should stay well hydrated while taking this medicine. Drink plenty of fluids. What side effects may I notice from receiving this medicine? Side effects that you should report to your doctor or health care professional as soon as possible:  allergic reactions like skin rash, itching or hives, swelling of the face, lips, or tongue  aggressive behavior  confusion  hallucinations  problems with balance, talking, walking  stomach pain  tremor  trouble passing urine or change in the amount of urine Side effects that usually do not require medical attention (report to your doctor or health care professional if they continue or are  bothersome):  dizziness  headache  nausea, vomiting This list may not describe all possible side effects. Call your doctor for medical advice about side effects. You may report side effects to FDA at 1-800-FDA-1088. Where should I keep my medicine? Keep out of the reach of children. Store at room temperature between 15 and 25 degrees C (59 and 77 degrees F). Keep container tightly closed. Throw away any unused medicine after the expiration date. NOTE: This sheet is a summary. It may not cover all possible information. If you have questions about this medicine, talk to your doctor, pharmacist, or health care provider.  2020 Elsevier/Gold Standard (2018-01-31 12:22:33)

## 2019-06-18 LAB — HERPES SIMPLEX VIRUS CULTURE

## 2019-06-19 LAB — HSV TYPE I/II IGG, IGMW/ REFLEX
HSV 1 Glycoprotein G Ab, IgG: 8 index — ABNORMAL HIGH (ref 0.00–0.90)
HSV 1 IgM: <1:10 {titer}
HSV 2 IgG, Type Spec: <0.91 index (ref 0.00–0.90)
HSV 2 IgM: <1:10 {titer}

## 2019-06-19 LAB — RPR: RPR Ser Ql: NONREACTIVE

## 2019-06-20 LAB — CERVICOVAGINAL ANCILLARY ONLY
Bacterial Vaginitis (gardnerella): NEGATIVE
Candida Glabrata: NEGATIVE
Candida Vaginitis: NEGATIVE
Chlamydia: NEGATIVE
Comment: NEGATIVE
Comment: NEGATIVE
Comment: NEGATIVE
Comment: NEGATIVE
Comment: NEGATIVE
Comment: NORMAL
Neisseria Gonorrhea: NEGATIVE
Trichomonas: NEGATIVE

## 2019-07-06 ENCOUNTER — Ambulatory Visit (INDEPENDENT_AMBULATORY_CARE_PROVIDER_SITE_OTHER): Payer: Self-pay | Admitting: Certified Nurse Midwife

## 2019-07-06 ENCOUNTER — Encounter: Payer: Self-pay | Admitting: Certified Nurse Midwife

## 2019-07-06 ENCOUNTER — Other Ambulatory Visit: Payer: Self-pay

## 2019-07-06 VITALS — BP 114/68 | HR 84 | Ht 66.0 in | Wt 150.4 lb

## 2019-07-06 DIAGNOSIS — Z8619 Personal history of other infectious and parasitic diseases: Secondary | ICD-10-CM

## 2019-07-06 NOTE — Patient Instructions (Addendum)
Valacyclovir caplets What is this medicine? VALACYCLOVIR (val ay SYE kloe veer) is an antiviral medicine. It is used to treat or prevent infections caused by certain kinds of viruses. Examples of these infections include herpes and shingles. This medicine will not cure herpes. This medicine may be used for other purposes; ask your health care provider or pharmacist if you have questions. COMMON BRAND NAME(S): Valtrex What should I tell my health care provider before I take this medicine? They need to know if you have any of these conditions:  acquired immunodeficiency syndrome (AIDS)  any other condition that may weaken the immune system  bone marrow or kidney transplant  kidney disease  an unusual or allergic reaction to valacyclovir, acyclovir, ganciclovir, valganciclovir, other medicines, foods, dyes, or preservatives  pregnant or trying to get pregnant  breast-feeding How should I use this medicine? Take this medicine by mouth with a glass of water. Follow the directions on the prescription label. You can take this medicine with or without food. Take your doses at regular intervals. Do not take your medicine more often than directed. Finish the full course prescribed by your doctor or health care professional even if you think your condition is better. Do not stop taking except on the advice of your doctor or health care professional. Talk to your pediatrician regarding the use of this medicine in children. While this drug may be prescribed for children as young as 2 years for selected conditions, precautions do apply. Overdosage: If you think you have taken too much of this medicine contact a poison control center or emergency room at once. NOTE: This medicine is only for you. Do not share this medicine with others. What if I miss a dose? If you miss a dose, take it as soon as you can. If it is almost time for your next dose, take only that dose. Do not take double or extra  doses. What may interact with this medicine? Do not take this medicine with any of the following medications:  cidofovir This medicine may also interact with the following medications:  adefovir  amphotericin B  certain antibiotics like amikacin, gentamicin, tobramycin, vancomycin  cimetidine  cisplatin  colistin  cyclosporine  foscarnet  lithium  methotrexate  probenecid  tacrolimus This list may not describe all possible interactions. Give your health care provider a list of all the medicines, herbs, non-prescription drugs, or dietary supplements you use. Also tell them if you smoke, drink alcohol, or use illegal drugs. Some items may interact with your medicine. What should I watch for while using this medicine? Tell your doctor or health care professional if your symptoms do not start to get better after 1 week. This medicine works best when taken early in the course of an infection, within the first 59 hours. Begin treatment as soon as possible after the first signs of infection like tingling, itching, or pain in the affected area. It is possible that genital herpes may still be spread even when you are not having symptoms. Always use safer sex practices like condoms made of latex or polyurethane whenever you have sexual contact. You should stay well hydrated while taking this medicine. Drink plenty of fluids. What side effects may I notice from receiving this medicine? Side effects that you should report to your doctor or health care professional as soon as possible:  allergic reactions like skin rash, itching or hives, swelling of the face, lips, or tongue  aggressive behavior  confusion  hallucinations  problems  with balance, talking, walking  stomach pain  tremor  trouble passing urine or change in the amount of urine Side effects that usually do not require medical attention (report to your doctor or health care professional if they continue or are  bothersome):  dizziness  headache  nausea, vomiting This list may not describe all possible side effects. Call your doctor for medical advice about side effects. You may report side effects to FDA at 1-800-FDA-1088. Where should I keep my medicine? Keep out of the reach of children. Store at room temperature between 15 and 25 degrees C (59 and 77 degrees F). Keep container tightly closed. Throw away any unused medicine after the expiration date. NOTE: This sheet is a summary. It may not cover all possible information. If you have questions about this medicine, talk to your doctor, pharmacist, or health care provider.  2020 Elsevier/Gold Standard (2018-01-31 12:22:33)    Cold Sore  A cold sore, also called a fever blister, is a small, fluid-filled sore that forms inside the mouth or on the lips, gums, nose, chin, or cheeks. Cold sores can spread to other parts of the body, such as the eyes or fingers. In some people who have other medical conditions, cold sores can spread to multiple other body sites, including the genitals. Cold sores can spread from person to person (are contagious) until the sores crust over completely. Most cold sores go away within 2 weeks. What are the causes? Cold sores are caused by an infection from a common type of herpes simplex virus (HSV-1). HSV-1 is closely related to the HSV-2virus, which is the virus that causes genital herpes, but these viruses are not the same. Once a person is infected with HSV-1, the virus remains permanently in the body. HSV-1 is spread from person to person through close contact, such as through kissing, touching the affected area, or sharing personal items such as lip balm, razors, a drinking glass, or eating utensils. What increases the risk? You are more likely to develop this condition if you:  Are tired, stressed, or sick.  Are menstruating.  Are pregnant.  Take certain medicines.  Are exposed to cold weather or too much  sun. What are the signs or symptoms? Symptoms of a cold sore outbreak go through different stages. These are the stages of a cold sore:  Tingling, itching, or burning is felt 1-2 days before the outbreak.  Fluid-filled blisters appear on the lips, inside the mouth, on the nose, or on the cheeks.  The blisters start to ooze clear fluid.  The blisters dry up, and a yellow crust appears in their place.  The crust falls off. In some cases, other symptoms can develop during a cold sore outbreak. These can include:  Fever.  Sore throat.  Headache.  Muscle aches.  Swollen neck glands. How is this diagnosed? This condition is diagnosed based on your medical history and a physical exam. Your health care provider may do a blood test or may swab some fluid from your sore and then examine the swab in the lab. How is this treated? There is no cure for cold sores or HSV-1. There is also no vaccine for HSV-1. Most cold sores go away on their own without treatment within 2 weeks. Medicines cannot make the infection go away, but your health care provider may prescribe medicines to:  Help relieve some of the pain associated with the sores.  Work to stop the virus from multiplying.  Shorten healing time. Medicines  may be in the form of creams, gels, pills, or a shot. Follow these instructions at home: Medicines  Take or apply over-the-counter and prescription medicines only as told by your health care provider.  Use a cotton-tip swab to apply creams or gels to your sores.  Ask your health care provider if you can take lysine supplements. Research has found that lysine may help heal the cold sore faster and prevent outbreaks. Sore care   Do not touch the sores or pick the scabs.  Wash your hands often. Do not touch your eyes without washing your hands first.  Keep the sores clean and dry.  If directed, apply ice to the sores: ? Put ice in a plastic bag. ? Place a towel between your  skin and the bag. ? Leave the ice on for 20 minutes, 2-3 times a day. Eating and drinking  Eat a soft, bland diet. Avoid eating hot, cold, or salty foods.  Use a straw if it hurts to drink out of a glass.  Eat foods that are rich in lysine, such as meat, fish, and dairy products.  Avoid sugary foods, chocolates, nuts, and grains. These foods are rich in a nutrient called arginine, which can cause the virus to multiply. Lifestyle  Do not kiss, have oral sex, or share personal items until your sores heal.  Stress, poor sleep, and being out in the sun can trigger outbreaks. Make sure you: ? Do activities that help you relax, such as deep breathing exercises or meditation. ? Get enough sleep. ? Apply sunscreen on your lips before you go out in the sun. Contact a health care provider if:  You have symptoms for more than 2 weeks.  You have pus coming from the sores.  You have redness that is spreading.  You have pain or irritation in your eye.  You get sores on your genitals.  Your sores do not heal within 2 weeks.  You have frequent cold sore outbreaks. Get help right away if you have:  A fever and your symptoms suddenly get worse.  A headache and confusion.  Fatigue or loss of appetite.  A stiff neck or sensitivity to light. Summary  A cold sore, also called a fever blister, is a small, fluid-filled sore that forms inside the mouth or on the lips, gums, nose, chin, or cheeks.  Most cold sores go away on their own without treatment within 2 weeks. Your health care provider may prescribe medicines to help relieve some of the pain, work to stop the virus from multiplying, and shorten healing time.  Wash your hands often. Do not touch your eyes without washing your hands first.  Do not kiss, have oral sex, or share personal items until your sores heal.  Contact a health care provider if your sores do not heal within 2 weeks. This information is not intended to replace  advice given to you by your health care provider. Make sure you discuss any questions you have with your health care provider. Document Revised: 04/26/2018 Document Reviewed: 06/06/2017 Elsevier Patient Education  2020 Elsevier Inc.   Genital Herpes Genital herpes is a common sexually transmitted infection (STI) that is caused by a virus. The virus spreads from person to person through sexual contact. Infection can cause itching, blisters, and sores around the genitals or rectum. Symptoms may last several days and then go away This is called an outbreak. However, the virus remains in your body, so you may have more outbreaks in  the future. The time between outbreaks varies and can be months or years. Genital herpes affects men and women. It is particularly concerning for pregnant women because the virus can be passed to the baby during delivery and can cause serious problems. Genital herpes is also a concern for people who have a weak disease-fighting (immune) system. What are the causes? This condition is caused by the herpes simplex virus (HSV) type 1 or type 2. The virus may spread through:  Sexual contact with an infected person, including vaginal, anal, and oral sex.  Contact with fluid from a herpes sore.  The skin. This means that you can get herpes from an infected partner even if he or she does not have a visible sore or does not know that he or she is infected. What increases the risk? You are more likely to develop this condition if:  You have sex with many partners.  You do not use latex condoms during sex. What are the signs or symptoms? Most people do not have symptoms (asymptomatic) or have mild symptoms that may be mistaken for other skin problems. Symptoms may include:  Small red bumps near the genitals, rectum, or mouth. These bumps turn into blisters and then turn into sores.  Flu-like symptoms, including: ? Fever. ? Body aches. ? Swollen lymph  nodes. ? Headache.  Painful urination.  Pain and itching in the genital area or rectal area.  Vaginal discharge.  Tingling or shooting pain in the legs and buttocks. Generally, symptoms are more severe and last longer during the first (primary) outbreak. Flu-like symptoms are also more common during the primary outbreak. How is this diagnosed? Genital herpes may be diagnosed based on:  A physical exam.  Your medical history.  Blood tests.  A test of a fluid sample (culture) from an open sore. How is this treated? There is no cure for this condition, but treatment with antiviral medicines that are taken by mouth (orally) can do the following:  Speed up healing and relieve symptoms.  Help to reduce the spread of the virus to sexual partners.  Limit the chance of future outbreaks, or make future outbreaks shorter.  Lessen symptoms of future outbreaks. Your health care provider may also recommend pain relief medicines, such as aspirin or ibuprofen. Follow these instructions at home: Sexual activity  Do not have sexual contact during active outbreaks.  Practice safe sex. Latex condoms and female condoms may help prevent the spread of the herpes virus. General instructions  Keep the affected areas dry and clean.  Take over-the-counter and prescription medicines only as told by your health care provider.  Avoid rubbing or touching blisters and sores. If you do touch blisters or sores: ? Wash your hands thoroughly with soap and water. ? Do not touch your eyes afterward.  To help relieve pain or itching, you may take the following actions as directed by your health care provider: ? Apply a cold, wet cloth (cold compress) to affected areas 4-6 times a day. ? Apply a substance that protects your skin and reduces bleeding (astringent). ? Apply a gel that helps relieve pain around sores (lidocaine gel). ? Take a warm, shallow bath that cleans the genital area (sitz  bath).  Keep all follow-up visits as told by your health care provider. This is important. How is this prevented?  Use condoms. Although anyone can get genital herpes during sexual contact, even with the use of a condom, a condom can provide some protection.  Avoid  having multiple sexual partners.  Talk with your sexual partner about any symptoms either of you may have. Also, talk with your partner about any history of STIs.  Get tested for STIs before you have sex. Ask your partner to do the same.  Do not have sexual contact if you have symptoms of genital herpes. Contact a health care provider if:  Your symptoms are not improving with medicine.  Your symptoms return.  You have new symptoms.  You have a fever.  You have abdominal pain.  You have redness, swelling, or pain in your eye.  You notice new sores on other parts of your body.  You are a woman and experience bleeding between menstrual periods.  You have had herpes and you become pregnant or plan to become pregnant. Summary  Genital herpes is a common sexually transmitted infection (STI) that is caused by the herpes simplex virus (HSV) type 1 or type 2.  These viruses are most often spread through sexual contact with an infected person.  You are more likely to develop this condition if you have sex with many partners or you have unprotected sex.  Most people do not have symptoms (asymptomatic) or have mild symptoms that may be mistaken for other skin problems. Symptoms occur as outbreaks that may happen months or years apart.  There is no cure for this condition, but treatment with oral antiviral medicines can reduce symptoms, reduce the chance of spreading the virus to a partner, prevent future outbreaks, or shorten future outbreaks. This information is not intended to replace advice given to you by your health care provider. Make sure you discuss any questions you have with your health care provider. Document  Revised: 07/11/2017 Document Reviewed: 12/05/2015 Elsevier Patient Education  2020 [employer].

## 2019-07-06 NOTE — Progress Notes (Signed)
GYN ENCOUNTER NOTE  Subjective:       Tracy Mckinney is a 24 y.o. G31P0020 female here for follow up after HSV diagnosis and treatment.   Doing well, completed Valtrex as prescribed.Vesicles clear, pain gone.   Noted irregular bleeding but admitted when she had the vesicles she stopped taking her birth control pills.      Denies difficulty breathing or respiratory distress, chest pain, abdominal pain, excessive vaginal bleeding, dysuria, leg pain or swelling  Gynecologic History  Patient's last menstrual period was 06/06/2019 (exact date).  Contraception: OCP (estrogen/progesterone)   Last Pap: 11/28/18. Results were: Negative  Obstetric History  OB History  Gravida Para Term Preterm AB Living  2       2    SAB TAB Ectopic Multiple Live Births  1 1          # Outcome Date GA Lbr Len/2nd Weight Sex Delivery Anes PTL Lv  2 SAB 2018          1 TAB 2017            Past Surgical History:  Procedure Laterality Date  . WISDOM TOOTH EXTRACTION      Current Outpatient Medications on File Prior to Visit  Medication Sig Dispense Refill  . Norgestimate-Ethinyl Estradiol Triphasic (ORTHO TRI-CYCLEN, 28,) 0.18/0.215/0.25 MG-35 MCG tablet Take 1 tablet by mouth daily. 1 Package 13  . lidocaine (XYLOCAINE) 5 % ointment Apply 1 application topically as needed. (Patient not taking: Reported on 07/06/2019) 35.44 g 0  . ondansetron (ZOFRAN ODT) 4 MG disintegrating tablet Take 1 tablet (4 mg total) by mouth every 6 (six) hours as needed for nausea. (Patient not taking: Reported on 07/06/2019) 20 tablet 0  . valACYclovir (VALTREX) 1000 MG tablet Take 1 tablet (1,000 mg total) by mouth 2 (two) times daily. Take for ten days. (Patient not taking: Reported on 07/06/2019) 20 tablet 3   No current facility-administered medications on file prior to visit.    No Known Allergies  Social History   Socioeconomic History  . Marital status: Single    Spouse name: Not on file  . Number of children: Not  on file  . Years of education: Not on file  . Highest education level: Not on file  Occupational History  . Not on file  Tobacco Use  . Smoking status: Never Smoker  . Smokeless tobacco: Never Used  Vaping Use  . Vaping Use: Every day  Substance and Sexual Activity  . Alcohol use: Yes    Comment: occas  . Drug use: Yes    Frequency: 2.0 times per week    Types: Marijuana  . Sexual activity: Yes    Partners: Male    Birth control/protection: Pill, Condom  Other Topics Concern  . Not on file  Social History Narrative  . Not on file   Social Determinants of Health   Financial Resource Strain:   . Difficulty of Paying Living Expenses:   Food Insecurity:   . Worried About Charity fundraiser in the Last Year:   . Arboriculturist in the Last Year:   Transportation Needs:   . Film/video editor (Medical):   Marland Kitchen Lack of Transportation (Non-Medical):   Physical Activity:   . Days of Exercise per Week:   . Minutes of Exercise per Session:   Stress:   . Feeling of Stress :   Social Connections:   . Frequency of Communication with Friends and Family:   .  Frequency of Social Gatherings with Friends and Family:   . Attends Religious Services:   . Active Member of Clubs or Organizations:   . Attends Banker Meetings:   Marland Kitchen Marital Status:   Intimate Partner Violence:   . Fear of Current or Ex-Partner:   . Emotionally Abused:   Marland Kitchen Physically Abused:   . Sexually Abused:     Family History  Problem Relation Age of Onset  . Hypertension Mother   . Migraines Mother   . Bipolar disorder Mother   . Cancer Father     The following portions of the patient's history were reviewed and updated as appropriate: allergies, current medications, past family history, past medical history, past social history, past surgical history and problem list.  Review of Systems  ROS- negative except as noted above. Information obtained from patient.   Objective:   BP 114/68    Pulse 84   Ht 5\' 6"  (1.676 m)   Wt 150 lb 6 oz (68.2 kg)   LMP 06/06/2019 (Exact Date)   BMI 24.27 kg/m   CONSTITUTIONAL: Well-developed, well-nourished female in no acute distress.   PELVIC: Patient declines exam  Assessment:   1. 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  Discussed treatment options for Valtrex. Patient requests to take as needed for outbreaks.   Reviewed red flags and when to call the office.  RTC x 5 months for ANNUAL EXAM or sooner if needed.  06/08/2019 RN [facility] [employer] 07/06/19 10:59 AM

## 2019-07-06 NOTE — Progress Notes (Signed)
I have seen, interviewed, and examined the patient in conjunction with the [employer] Nurse Practitioner student and affirm the diagnosis and management plan.   Gunnar Bulla, CNM [facility] 07/06/19 2:01 PM

## 2019-12-06 ENCOUNTER — Ambulatory Visit (INDEPENDENT_AMBULATORY_CARE_PROVIDER_SITE_OTHER): Payer: Medicaid Other | Admitting: Certified Nurse Midwife

## 2019-12-06 ENCOUNTER — Encounter: Payer: Self-pay | Admitting: Certified Nurse Midwife

## 2019-12-06 ENCOUNTER — Other Ambulatory Visit: Payer: Self-pay

## 2019-12-06 VITALS — BP 103/68 | HR 84 | Wt 158.3 lb

## 2019-12-06 DIAGNOSIS — Z3041 Encounter for surveillance of contraceptive pills: Secondary | ICD-10-CM | POA: Diagnosis not present

## 2019-12-06 DIAGNOSIS — Z01419 Encounter for gynecological examination (general) (routine) without abnormal findings: Secondary | ICD-10-CM | POA: Diagnosis not present

## 2019-12-06 MED ORDER — NORGESTIM-ETH ESTRAD TRIPHASIC 0.18/0.215/0.25 MG-35 MCG PO TABS: 1.0000 | ORAL_TABLET | Freq: Every day | ORAL | 4 refills | Status: AC

## 2019-12-06 NOTE — Progress Notes (Signed)
ANNUAL PREVENTATIVE CARE GYN  ENCOUNTER NOTE  Subjective:       Tracy Mckinney is a 24 y.o. G13P0020 female here for a routine annual gynecologic exam.  Current complaints: 1. Needs OCP refill  Starting Valtrex a few weeks ago after outbreak, feeling better.   Denies difficulty breathing or respiratory distress, chest pain, abdominal pain, excessive vaginal bleeding, dysuria, and leg pain or swelling.    Gynecologic History  Patient's last menstrual period was 11/27/2019. Period Cycle (Days): 28 Period Duration (Days): 5-7 Period Pattern: Regular Menstrual Flow: Moderate, Heavy Menstrual Control: Thin pad Menstrual Control Change Freq (Hours): 4 Dysmenorrhea: None  Contraception: OCP (estrogen/progesterone)   Last Pap: 11/2018. Results were: normal  Obstetric History  OB History  Gravida Para Term Preterm AB Living  2       2    SAB TAB Ectopic Multiple Live Births  1 1          # Outcome Date GA Lbr Len/2nd Weight Sex Delivery Anes PTL Lv  2 SAB 2018          1 TAB 2017            History reviewed. No pertinent past medical history.  Past Surgical History:  Procedure Laterality Date  . WISDOM TOOTH EXTRACTION      Current Outpatient Medications on File Prior to Visit  Medication Sig Dispense Refill  . Norgestimate-Ethinyl Estradiol Triphasic (ORTHO TRI-CYCLEN, 28,) 0.18/0.215/0.25 MG-35 MCG tablet Take 1 tablet by mouth daily. 1 Package 13  . valACYclovir (VALTREX) 1000 MG tablet Take 1 tablet (1,000 mg total) by mouth 2 (two) times daily. Take for ten days. 20 tablet 3   No current facility-administered medications on file prior to visit.    No Known Allergies  Social History   Socioeconomic History  . Marital status: Single    Spouse name: Not on file  . Number of children: Not on file  . Years of education: Not on file  . Highest education level: Not on file  Occupational History  . Not on file  Tobacco Use  . Smoking status: Current Every Day Smoker     Types: E-cigarettes  . Smokeless tobacco: Never Used  Vaping Use  . Vaping Use: Every day  . Substances: Nicotine, THC, CBD, Flavoring, Nicotine-salt  Substance and Sexual Activity  . Alcohol use: Not Currently    Comment: occas  . Drug use: Yes    Frequency: 2.0 times per week    Types: Marijuana  . Sexual activity: Yes    Partners: Male    Birth control/protection: Pill, Condom  Other Topics Concern  . Not on file  Social History Narrative  . Not on file   Social Determinants of Health   Financial Resource Strain:   . Difficulty of Paying Living Expenses: Not on file  Food Insecurity:   . Worried About Programme researcher, broadcasting/film/video in the Last Year: Not on file  . Ran Out of Food in the Last Year: Not on file  Transportation Needs:   . Lack of Transportation (Medical): Not on file  . Lack of Transportation (Non-Medical): Not on file  Physical Activity:   . Days of Exercise per Week: Not on file  . Minutes of Exercise per Session: Not on file  Stress:   . Feeling of Stress : Not on file  Social Connections:   . Frequency of Communication with Friends and Family: Not on file  . Frequency of Social Gatherings  with Friends and Family: Not on file  . Attends Religious Services: Not on file  . Active Member of Clubs or Organizations: Not on file  . Attends Banker Meetings: Not on file  . Marital Status: Not on file  Intimate Partner Violence:   . Fear of Current or Ex-Partner: Not on file  . Emotionally Abused: Not on file  . Physically Abused: Not on file  . Sexually Abused: Not on file    Family History  Problem Relation Age of Onset  . Hypertension Mother   . Migraines Mother   . Bipolar disorder Mother   . Cancer Father     The following portions of the patient's history were reviewed and updated as appropriate: allergies, current medications, past family history, past medical history, past social history, past surgical history and problem  list.  Review of Systems  ROS negative expect as noted above. Information obtained from patient.    Objective:   BP 103/68   Pulse 84   Wt 158 lb 4.8 oz (71.8 kg)   LMP 11/27/2019   BMI 25.55 kg/m    CONSTITUTIONAL: Well-developed, well-nourished female in no acute distress.   PSYCHIATRIC: Normal mood and affect. Normal behavior. Normal judgment and thought content.  NEUROLGIC: Alert and oriented to person, place, and time. Normal muscle tone coordination. No cranial nerve deficit noted.  HENT:  Normocephalic, atraumatic, External right and left ear normal.  EYES: Conjunctivae and EOM are normal. Pupils are equal and round.   NECK: Normal range of motion, supple, no masses.  Normal thyroid.   SKIN: Skin is warm and dry. No rash noted. Not diaphoretic. No  erythema. No pallor.  CARDIOVASCULAR: Normal heart rate noted, regular rhythm, no murmur.  RESPIRATORY: Clear to auscultation bilaterally. Effort and breath sounds normal, no problems with respiration noted.  BREASTS: Symmetric in size. No masses, skin changes, nipple drainage, or lymphadenopathy.  ABDOMEN: Soft, normal bowel sounds, no distention noted.  No tenderness, rebound or guarding.   PELVIC:  External Genitalia: Normal  Vagina: Normal  MUSCULOSKELETAL: Normal range of motion. No tenderness.  No cyanosis, clubbing, or edema.  2+ distal pulses.  LYMPHATIC: No Axillary, Supraclavicular, or Inguinal Adenopathy.  Assessment:   Annual gynecologic examination 24 y.o.   Contraception: OCP (estrogen/progesterone)   Normal BMI   Problem List Items Addressed This Visit    None    Visit Diagnoses    Well woman exam    -  Primary   Surveillance for birth control, oral contraceptives          Plan:   Pap: Not needed   Labs: Declined  Routine preventative health maintenance measures emphasized: Exercise/Diet/Weight control, Tobacco Warnings, Alcohol/Substance use risks and Stress Management; see  AVS  OCP refilled, see chart  Reviewed red flag symptoms and when to call  Return to Clinic - 1 Year for [facility] or sooner if needed   Serafina Royals, CNM  [facility], [facility] 12/06/19 8:40 AM

## 2019-12-06 NOTE — Patient Instructions (Addendum)
Preventive Care 21-22 Years Old, Female Preventive care refers to visits with your health care provider and lifestyle choices that can promote health and wellness. This includes:  A yearly physical exam. This may also be called an annual well check.  Regular dental visits and eye exams.  Immunizations.  Screening for certain conditions.  Healthy lifestyle choices, such as eating a healthy diet, getting regular exercise, not using drugs or products that contain nicotine and tobacco, and limiting alcohol use. What can I expect for my preventive care visit? Physical exam Your health care provider will check your:  Height and weight. This may be used to calculate body mass index (BMI), which tells if you are at a healthy weight.  Heart rate and blood pressure.  Skin for abnormal spots. Counseling Your health care provider may ask you questions about your:  Alcohol, tobacco, and drug use.  Emotional well-being.  Home and relationship well-being.  Sexual activity.  Eating habits.  Work and work environment.  Method of birth control.  Menstrual cycle.  Pregnancy history. What immunizations do I need?  Influenza (flu) vaccine  This is recommended every year. Tetanus, diphtheria, and pertussis (Tdap) vaccine  You may need a Td booster every 10 years. Varicella (chickenpox) vaccine  You may need this if you have not been vaccinated. Human papillomavirus (HPV) vaccine  If recommended by your health care provider, you may need three doses over 6 months. Measles, mumps, and rubella (MMR) vaccine  You may need at least one dose of MMR. You may also need a second dose. Meningococcal conjugate (MenACWY) vaccine  One dose is recommended if you are age 19-21 years and a first-year college student living in a residence hall, or if you have one of several medical conditions. You may also need additional booster doses. Pneumococcal conjugate (PCV13) vaccine  You may need  this if you have certain conditions and were not previously vaccinated. Pneumococcal polysaccharide (PPSV23) vaccine  You may need one or two doses if you smoke cigarettes or if you have certain conditions. Hepatitis A vaccine  You may need this if you have certain conditions or if you travel or work in places where you may be exposed to hepatitis A. Hepatitis B vaccine  You may need this if you have certain conditions or if you travel or work in places where you may be exposed to hepatitis B. Haemophilus influenzae type b (Hib) vaccine  You may need this if you have certain conditions. You may receive vaccines as individual doses or as more than one vaccine together in one shot (combination vaccines). Talk with your health care provider about the risks and benefits of combination vaccines. What tests do I need?  Blood tests  Lipid and cholesterol levels. These may be checked every 5 years starting at age 20.  Hepatitis C test.  Hepatitis B test. Screening  Diabetes screening. This is done by checking your blood sugar (glucose) after you have not eaten for a while (fasting).  Sexually transmitted disease (STD) testing.  BRCA-related cancer screening. This may be done if you have a family history of breast, ovarian, tubal, or peritoneal cancers.  Pelvic exam and Pap test. This may be done every 3 years starting at age 21. Starting at age 30, this may be done every 5 years if you have a Pap test in combination with an HPV test. Talk with your health care provider about your test results, treatment options, and if necessary, the need for more tests.   Follow these instructions at home: Eating and drinking   Eat a diet that includes fresh fruits and vegetables, whole grains, lean protein, and low-fat dairy.  Take vitamin and mineral supplements as recommended by your health care provider.  Do not drink alcohol if: ? Your health care provider tells you not to drink. ? You are  pregnant, may be pregnant, or are planning to become pregnant.  If you drink alcohol: ? Limit how much you have to 0-1 drink a day. ? Be aware of how much alcohol is in your drink. In the [place] one drink equals one 12 oz bottle of beer (355 mL), one 5 oz glass of wine (148 mL), or one 1 oz glass of hard liquor (44 mL). Lifestyle  Take daily care of your teeth and gums.  Stay active. Exercise for at least 30 minutes on 5 or more days each week.  Do not use any products that contain nicotine or tobacco, such as cigarettes, e-cigarettes, and chewing tobacco. If you need help quitting, ask your health care provider.  If you are sexually active, practice safe sex. Use a condom or other form of birth control (contraception) in order to prevent pregnancy and STIs (sexually transmitted infections). If you plan to become pregnant, see your health care provider for a preconception visit. What's next?  Visit your health care provider once a year for a well check visit.  Ask your health care provider how often you should have your eyes and teeth checked.  Stay up to date on all vaccines. This information is not intended to replace advice given to you by your health care provider. Make sure you discuss any questions you have with your health care provider. Document Revised: 09/15/2017 Document Reviewed: 09/15/2017 Elsevier Patient Education  2020 Elsevier Inc.    Ethinyl Estradiol; Norgestimate tablets What is this medicine? ETHINYL ESTRADIOL; NORGESTIMATE (ETH in il es tra DYE ole; nor JES ti mate) is an oral contraceptive. The products combine two types of female hormones, an estrogen and a progestin. They are used to prevent ovulation and pregnancy. Some products are also used to treat acne in females. This medicine may be used for other purposes; ask your health care provider or pharmacist if you have questions. COMMON BRAND NAME(S): Estarylla, Mili, MONO-LINYAH, MonoNessa,  Norgestimate/Ethinyl Estradiol, Ortho Tri-Cyclen, Ortho Tri-Cyclen Lo, Ortho-Cyclen, Previfem, Sprintec, Tri-Estarylla, TRI-LINYAH, Tri-Lo-Estarylla, Tri-Lo-Marzia, Tri-Lo-Mili, Tri-Lo-Sprintec, Tri-Mili, Tri-Previfem, Tri-Sprintec, Tri-VyLibra, Trinessa, Trinessa Lo, VyLibra What should I tell my health care provider before I take this medicine? They need to know if you have or ever had any of these conditions:  abnormal vaginal bleeding  blood vessel disease or blood clots  breast, cervical, endometrial, ovarian, liver, or uterine cancer  diabetes  gallbladder disease  heart disease or recent heart attack  high blood pressure  high cholesterol  kidney disease  liver disease  migraine headaches  stroke  systemic lupus erythematosus (SLE)  tobacco smoker  an unusual or allergic reaction to estrogens, progestins, other medicines, foods, dyes, or preservatives  pregnant or trying to get pregnant  breast-feeding How should I use this medicine? Take this medicine by mouth. To reduce nausea, this medicine may be taken with food. Follow the directions on the prescription label. Take this medicine at the same time each day and in the order directed on the package. Do not take your medicine more often than directed. Contact your pediatrician regarding the use of this medicine in children. Special care may be needed. This medicine   has been used in female children who have started having menstrual periods. A patient package insert for the product will be given with each prescription and refill. Read this sheet carefully each time. The sheet may change frequently. Overdosage: If you think you have taken too much of this medicine contact a poison control center or emergency room at once. NOTE: This medicine is only for you. Do not share this medicine with others. What if I miss a dose? If you miss a dose, refer to the patient information sheet you received with your medicine for  direction. If you miss more than one pill, this medicine may not be as effective and you may need to use another form of birth control. What may interact with this medicine? Do not take this medicine with the following medication:  dasabuvir; ombitasvir; paritaprevir; ritonavir  ombitasvir; paritaprevir; ritonavir This medicine may also interact with the following medications:  acetaminophen  antibiotics or medicines for infections, especially rifampin, rifabutin, rifapentine, and griseofulvin, and possibly penicillins or tetracyclines  aprepitant  ascorbic acid (vitamin C)  atorvastatin  barbiturate medicines, such as phenobarbital  bosentan  carbamazepine  caffeine  clofibrate  cyclosporine  dantrolene  doxercalciferol  felbamate  grapefruit juice  hydrocortisone  medicines for anxiety or sleeping problems, such as diazepam or temazepam  medicines for diabetes, including pioglitazone  mineral oil  modafinil  mycophenolate  nefazodone  oxcarbazepine  phenytoin  prednisolone  ritonavir or other medicines for HIV infection or AIDS  rosuvastatin  selegiline  soy isoflavones supplements  St. John's wort  tamoxifen or raloxifene  theophylline  thyroid hormones  topiramate  warfarin This list may not describe all possible interactions. Give your health care provider a list of all the medicines, herbs, non-prescription drugs, or dietary supplements you use. Also tell them if you smoke, drink alcohol, or use illegal drugs. Some items may interact with your medicine. What should I watch for while using this medicine? Visit your doctor or health care professional for regular checks on your progress. You will need a regular breast and pelvic exam and Pap smear while on this medicine. You should also discuss the need for regular mammograms with your health care professional, and follow his or her guidelines for these tests. This medicine can make  your body retain fluid, making your fingers, hands, or ankles swell. Your blood pressure can go up. Contact your doctor or health care professional if you feel you are retaining fluid. Use an additional method of contraception during the first cycle that you take these tablets. If you have any reason to think you are pregnant, stop taking this medicine right away and contact your doctor or health care professional. If you are taking this medicine for hormone related problems, it may take several cycles of use to see improvement in your condition. Do not use this product if you smoke and are over 35 years of age. Smoking increases the risk of getting a blood clot or having a stroke while you are taking birth control pills, especially if you are more than 22 years old. If you are a smoker who is 35 years of age or younger, you are strongly advised not to smoke while taking birth control pills. This medicine can make you more sensitive to the sun. Keep out of the sun. If you cannot avoid being in the sun, wear protective clothing and use sunscreen. Do not use sun lamps or tanning beds/booths. If you wear contact lenses and notice visual changes,   or if the lenses begin to feel uncomfortable, consult your eye care specialist. In some women, tenderness, swelling, or minor bleeding of the gums may occur. Notify your dentist if this happens. Brushing and flossing your teeth regularly may help limit this. See your dentist regularly and inform your dentist of the medicines you are taking. If you are going to have elective surgery, you may need to stop taking this medicine before the surgery. Consult your health care professional for advice. This medicine does not protect you against HIV infection (AIDS) or any other sexually transmitted diseases. What side effects may I notice from receiving this medicine? Side effects that you should report to your doctor or health care professional as soon as possible:  breast  tissue changes or discharge  changes in vaginal bleeding during your period or between your periods  chest pain  coughing up blood  dizziness or fainting spells  headaches or migraines  leg, arm or groin pain  severe or sudden headaches  stomach pain (severe)  sudden shortness of breath  sudden loss of coordination, especially on one side of the body  speech problems  symptoms of vaginal infection like itching, irritation or unusual discharge  tenderness in the upper abdomen  vomiting  weakness or numbness in the arms or legs, especially on one side of the body  yellowing of the eyes or skin Side effects that usually do not require medical attention (report to your doctor or health care professional if they continue or are bothersome):  breakthrough bleeding and spotting that continues beyond the 3 initial cycles of pills  breast tenderness  mood changes, anxiety, depression, frustration, anger, or emotional outbursts  increased sensitivity to sun or ultraviolet light  nausea  skin rash, acne, or brown spots on the skin  weight gain (slight) This list may not describe all possible side effects. Call your doctor for medical advice about side effects. You may report side effects to FDA at 1-800-FDA-1088. Where should I keep my medicine? Keep out of the reach of children. Store at room temperature between 15 and 30 degrees C (59 and 86 degrees F). Throw away any unused medicine after the expiration date. NOTE: This sheet is a summary. It may not cover all possible information. If you have questions about this medicine, talk to your doctor, pharmacist, or health care provider.  2020 Elsevier/Gold Standard (2015-09-15 08:09:09)  

## 2020-12-18 ENCOUNTER — Encounter: Payer: Medicaid Other | Admitting: Certified Nurse Midwife
